# Patient Record
Sex: Male | Born: 1940 | Race: White | Hispanic: No | Marital: Married
Health system: Southern US, Community
[De-identification: ages and names within clinical notes are randomized; demographics above are authoritative.]

---

## 2005-05-15 ENCOUNTER — Ambulatory Visit: Payer: Self-pay | Admitting: Internal Medicine

## 2005-05-22 ENCOUNTER — Ambulatory Visit: Payer: Self-pay | Admitting: Neurology

## 2005-05-29 ENCOUNTER — Ambulatory Visit: Payer: Self-pay | Admitting: Neurology

## 2006-08-05 ENCOUNTER — Ambulatory Visit: Payer: Self-pay | Admitting: Urology

## 2006-08-05 ENCOUNTER — Other Ambulatory Visit: Payer: Self-pay

## 2006-08-10 ENCOUNTER — Ambulatory Visit: Payer: Self-pay | Admitting: Urology

## 2010-01-26 ENCOUNTER — Emergency Department: Payer: Self-pay | Admitting: Unknown Physician Specialty

## 2010-10-17 ENCOUNTER — Inpatient Hospital Stay: Payer: Self-pay | Admitting: Vascular Surgery

## 2010-11-04 ENCOUNTER — Inpatient Hospital Stay: Payer: Self-pay | Admitting: Vascular Surgery

## 2010-11-13 LAB — PATHOLOGY REPORT

## 2011-10-10 ENCOUNTER — Emergency Department: Payer: Self-pay | Admitting: *Deleted

## 2011-10-10 LAB — BASIC METABOLIC PANEL
Anion Gap: 8 (ref 7–16)
Calcium, Total: 9.1 mg/dL (ref 8.5–10.1)
Chloride: 105 mmol/L (ref 98–107)
Co2: 23 mmol/L (ref 21–32)
Potassium: 3.6 mmol/L (ref 3.5–5.1)
Sodium: 136 mmol/L (ref 136–145)

## 2011-10-10 LAB — CBC
HCT: 44.1 % (ref 40.0–52.0)
MCHC: 32.6 g/dL (ref 32.0–36.0)
Platelet: 210 10*3/uL (ref 150–440)
RBC: 4.65 10*6/uL (ref 4.40–5.90)
RDW: 13.8 % (ref 11.5–14.5)
WBC: 8.7 10*3/uL (ref 3.8–10.6)

## 2011-10-10 LAB — URINALYSIS, COMPLETE
Bilirubin,UR: NEGATIVE
Glucose,UR: NEGATIVE mg/dL (ref 0–75)
Nitrite: NEGATIVE
Ph: 8 (ref 4.5–8.0)
Protein: 100
RBC,UR: 2 /HPF (ref 0–5)
Specific Gravity: 1.008 (ref 1.003–1.030)
WBC UR: 296 /HPF (ref 0–5)

## 2011-10-10 LAB — CK TOTAL AND CKMB (NOT AT ARMC): CK-MB: 4.9 ng/mL — ABNORMAL HIGH (ref 0.5–3.6)

## 2011-10-10 LAB — TROPONIN I: Troponin-I: <0.02 ng/mL

## 2012-02-02 ENCOUNTER — Emergency Department: Payer: Self-pay | Admitting: Emergency Medicine

## 2012-02-02 LAB — COMPREHENSIVE METABOLIC PANEL
Albumin: 3.6 g/dL (ref 3.4–5.0)
Anion Gap: 7 (ref 7–16)
Bilirubin,Total: 0.4 mg/dL (ref 0.2–1.0)
Calcium, Total: 9.1 mg/dL (ref 8.5–10.1)
Chloride: 98 mmol/L (ref 98–107)
Co2: 26 mmol/L (ref 21–32)
Creatinine: 0.98 mg/dL (ref 0.60–1.30)
EGFR (African American): >60
Osmolality: 265 (ref 275–301)
Potassium: 4.5 mmol/L (ref 3.5–5.1)
SGOT(AST): 31 U/L (ref 15–37)
SGPT (ALT): 28 U/L (ref 12–78)

## 2012-02-02 LAB — CBC WITH DIFFERENTIAL/PLATELET
Basophil #: 0 10*3/uL (ref 0.0–0.1)
Basophil %: 0.6 %
Eosinophil #: 0 10*3/uL (ref 0.0–0.7)
Eosinophil %: 0.4 %
HCT: 40.1 % (ref 40.0–52.0)
HGB: 14 g/dL (ref 13.0–18.0)
Lymphocyte #: 0.8 10*3/uL — ABNORMAL LOW (ref 1.0–3.6)
Lymphocyte %: 14 %
MCH: 33 pg (ref 26.0–34.0)
MCHC: 34.8 g/dL (ref 32.0–36.0)
Monocyte #: 0.9 x10 3/mm (ref 0.2–1.0)
Neutrophil #: 3.8 10*3/uL (ref 1.4–6.5)
Platelet: 231 10*3/uL (ref 150–440)
RDW: 14 % (ref 11.5–14.5)

## 2012-09-14 ENCOUNTER — Inpatient Hospital Stay: Payer: Self-pay | Admitting: Internal Medicine

## 2012-09-14 LAB — CBC
HCT: 37.1 % — ABNORMAL LOW (ref 40.0–52.0)
MCH: 30.5 pg (ref 26.0–34.0)
MCHC: 33.5 g/dL (ref 32.0–36.0)
MCV: 91 fL (ref 80–100)
RBC: 4.08 10*6/uL — ABNORMAL LOW (ref 4.40–5.90)
RDW: 13.4 % (ref 11.5–14.5)

## 2012-09-14 LAB — BASIC METABOLIC PANEL
BUN: 23 mg/dL — ABNORMAL HIGH (ref 7–18)
Calcium, Total: 9.3 mg/dL (ref 8.5–10.1)
Chloride: 98 mmol/L (ref 98–107)
Co2: 25 mmol/L (ref 21–32)
Creatinine: 1.08 mg/dL (ref 0.60–1.30)
EGFR (African American): >60
Glucose: 109 mg/dL — ABNORMAL HIGH (ref 65–99)
Osmolality: 265 (ref 275–301)
Potassium: 3.5 mmol/L (ref 3.5–5.1)
Sodium: 130 mmol/L — ABNORMAL LOW (ref 136–145)

## 2012-09-14 LAB — CK TOTAL AND CKMB (NOT AT ARMC): CK-MB: 2.5 ng/mL (ref 0.5–3.6)

## 2012-09-14 LAB — TROPONIN I
Troponin-I: <0.02 ng/mL
Troponin-I: <0.02 ng/mL

## 2012-09-15 LAB — CBC WITH DIFFERENTIAL/PLATELET
Basophil %: 0.1 %
HCT: 32.4 % — ABNORMAL LOW (ref 40.0–52.0)
HGB: 11 g/dL — ABNORMAL LOW (ref 13.0–18.0)
Lymphocyte %: 10.9 %
Monocyte #: 0.2 x10 3/mm (ref 0.2–1.0)
Monocyte %: 8.8 %
Neutrophil %: 80.2 %
Platelet: 227 10*3/uL (ref 150–440)
RBC: 3.57 10*6/uL — ABNORMAL LOW (ref 4.40–5.90)
RDW: 13.5 % (ref 11.5–14.5)
WBC: 2.1 10*3/uL — ABNORMAL LOW (ref 3.8–10.6)

## 2012-09-15 LAB — BASIC METABOLIC PANEL
Anion Gap: 9 (ref 7–16)
BUN: 22 mg/dL — ABNORMAL HIGH (ref 7–18)
Calcium, Total: 8.8 mg/dL (ref 8.5–10.1)
Creatinine: 0.92 mg/dL (ref 0.60–1.30)
Glucose: 161 mg/dL — ABNORMAL HIGH (ref 65–99)
Osmolality: 279 (ref 275–301)
Potassium: 2.9 mmol/L — ABNORMAL LOW (ref 3.5–5.1)

## 2012-09-15 LAB — MAGNESIUM: Magnesium: 2 mg/dL

## 2012-09-15 LAB — TSH: Thyroid Stimulating Horm: 0.283 u[IU]/mL — ABNORMAL LOW

## 2012-09-16 LAB — BASIC METABOLIC PANEL
Anion Gap: 6 — ABNORMAL LOW (ref 7–16)
Calcium, Total: 9.1 mg/dL (ref 8.5–10.1)
Chloride: 111 mmol/L — ABNORMAL HIGH (ref 98–107)
Co2: 24 mmol/L (ref 21–32)
Creatinine: 0.99 mg/dL (ref 0.60–1.30)
EGFR (African American): >60
EGFR (Non-African Amer.): >60
Glucose: 93 mg/dL (ref 65–99)
Potassium: 3.1 mmol/L — ABNORMAL LOW (ref 3.5–5.1)
Sodium: 141 mmol/L (ref 136–145)

## 2012-09-16 LAB — CBC WITH DIFFERENTIAL/PLATELET
HGB: 11.6 g/dL — ABNORMAL LOW (ref 13.0–18.0)
MCH: 31.3 pg (ref 26.0–34.0)
MCHC: 34.5 g/dL (ref 32.0–36.0)
MCV: 91 fL (ref 80–100)
Monocyte #: 1.1 x10 3/mm — ABNORMAL HIGH (ref 0.2–1.0)
Monocyte %: 12.4 %
Neutrophil #: 7 10*3/uL — ABNORMAL HIGH (ref 1.4–6.5)
Neutrophil %: 81.7 %
Platelet: 279 10*3/uL (ref 150–440)
RBC: 3.7 10*6/uL — ABNORMAL LOW (ref 4.40–5.90)
WBC: 8.5 10*3/uL (ref 3.8–10.6)

## 2012-10-07 ENCOUNTER — Other Ambulatory Visit: Payer: Self-pay

## 2012-10-07 LAB — URINALYSIS, COMPLETE
Bacteria: NONE SEEN
Blood: NEGATIVE
Glucose,UR: NEGATIVE mg/dL (ref 0–75)
Ketone: NEGATIVE
Nitrite: NEGATIVE
Protein: NEGATIVE
RBC,UR: 1 /HPF (ref 0–5)
Specific Gravity: 1.009 (ref 1.003–1.030)
Squamous Epithelial: NONE SEEN

## 2013-01-15 ENCOUNTER — Inpatient Hospital Stay: Payer: Self-pay | Admitting: Internal Medicine

## 2013-01-15 LAB — URINALYSIS, COMPLETE
Bilirubin,UR: NEGATIVE
Cellular Cast: 2
Nitrite: NEGATIVE
Ph: 7 (ref 4.5–8.0)
Protein: 30
RBC,UR: <1 /HPF (ref 0–5)
Specific Gravity: 1.005 (ref 1.003–1.030)
Squamous Epithelial: NONE SEEN

## 2013-01-15 LAB — CBC
HCT: 29.5 % — ABNORMAL LOW (ref 40.0–52.0)
HGB: 10.1 g/dL — ABNORMAL LOW (ref 13.0–18.0)
MCH: 30.6 pg (ref 26.0–34.0)
MCHC: 34.3 g/dL (ref 32.0–36.0)
Platelet: 278 10*3/uL (ref 150–440)
RBC: 3.3 10*6/uL — ABNORMAL LOW (ref 4.40–5.90)
RDW: 15.3 % — ABNORMAL HIGH (ref 11.5–14.5)

## 2013-01-15 LAB — BASIC METABOLIC PANEL
Anion Gap: 8 (ref 7–16)
BUN: 14 mg/dL (ref 7–18)
Calcium, Total: 8.5 mg/dL (ref 8.5–10.1)
Chloride: 109 mmol/L — ABNORMAL HIGH (ref 98–107)
Co2: 21 mmol/L (ref 21–32)
Creatinine: 1.43 mg/dL — ABNORMAL HIGH (ref 0.60–1.30)
EGFR (African American): 56 — ABNORMAL LOW
EGFR (Non-African Amer.): 48 — ABNORMAL LOW
Glucose: 102 mg/dL — ABNORMAL HIGH (ref 65–99)
Osmolality: 276 (ref 275–301)
Potassium: 2 mmol/L — CL (ref 3.5–5.1)
Sodium: 138 mmol/L (ref 136–145)

## 2013-01-15 LAB — TROPONIN I: Troponin-I: 0.03 ng/mL

## 2013-01-16 LAB — CBC WITH DIFFERENTIAL/PLATELET
Basophil #: 0 10*3/uL (ref 0.0–0.1)
Basophil %: 0.5 %
Eosinophil #: 0.1 10*3/uL (ref 0.0–0.7)
Eosinophil %: 2.2 %
HCT: 25 % — ABNORMAL LOW (ref 40.0–52.0)
Lymphocyte #: 0.4 10*3/uL — ABNORMAL LOW (ref 1.0–3.6)
Lymphocyte %: 9.4 %
Monocyte #: 0.6 x10 3/mm (ref 0.2–1.0)
Monocyte %: 12.6 %
Neutrophil %: 75.3 %
Platelet: 249 10*3/uL (ref 150–440)
RDW: 16 % — ABNORMAL HIGH (ref 11.5–14.5)
WBC: 4.4 10*3/uL (ref 3.8–10.6)

## 2013-01-16 LAB — BASIC METABOLIC PANEL
Anion Gap: 7 (ref 7–16)
Chloride: 119 mmol/L — ABNORMAL HIGH (ref 98–107)
Co2: 18 mmol/L — ABNORMAL LOW (ref 21–32)
EGFR (Non-African Amer.): >60
Osmolality: 286 (ref 275–301)
Potassium: 2.9 mmol/L — ABNORMAL LOW (ref 3.5–5.1)
Sodium: 144 mmol/L (ref 136–145)

## 2013-01-16 LAB — MAGNESIUM: Magnesium: 1.9 mg/dL

## 2013-01-17 LAB — BASIC METABOLIC PANEL
Anion Gap: 7 (ref 7–16)
BUN: 10 mg/dL (ref 7–18)
Calcium, Total: 8.2 mg/dL — ABNORMAL LOW (ref 8.5–10.1)
Chloride: 123 mmol/L — ABNORMAL HIGH (ref 98–107)
Creatinine: 1.09 mg/dL (ref 0.60–1.30)
EGFR (African American): >60
EGFR (Non-African Amer.): >60
Glucose: 78 mg/dL (ref 65–99)
Osmolality: 287 (ref 275–301)
Potassium: 3.8 mmol/L (ref 3.5–5.1)

## 2013-04-24 DEATH — deceased

## 2013-11-17 IMAGING — CR DG CHEST 2V
1 series · 2 of 2 positions shown · non-contrast
Comparison: none

REASON FOR EXAM: COPD/ Pneumonia
COMMENTS:

PROCEDURE:     DXR - DXR CHEST PA (OR AP) AND LATERAL  - September 15, 2012  [DATE]
RESULT:     Comparison: 09/14/2012

[Series 1: w chest pa · 0.14mm/px · 2 of 2 slices shown]
[im 1/2]
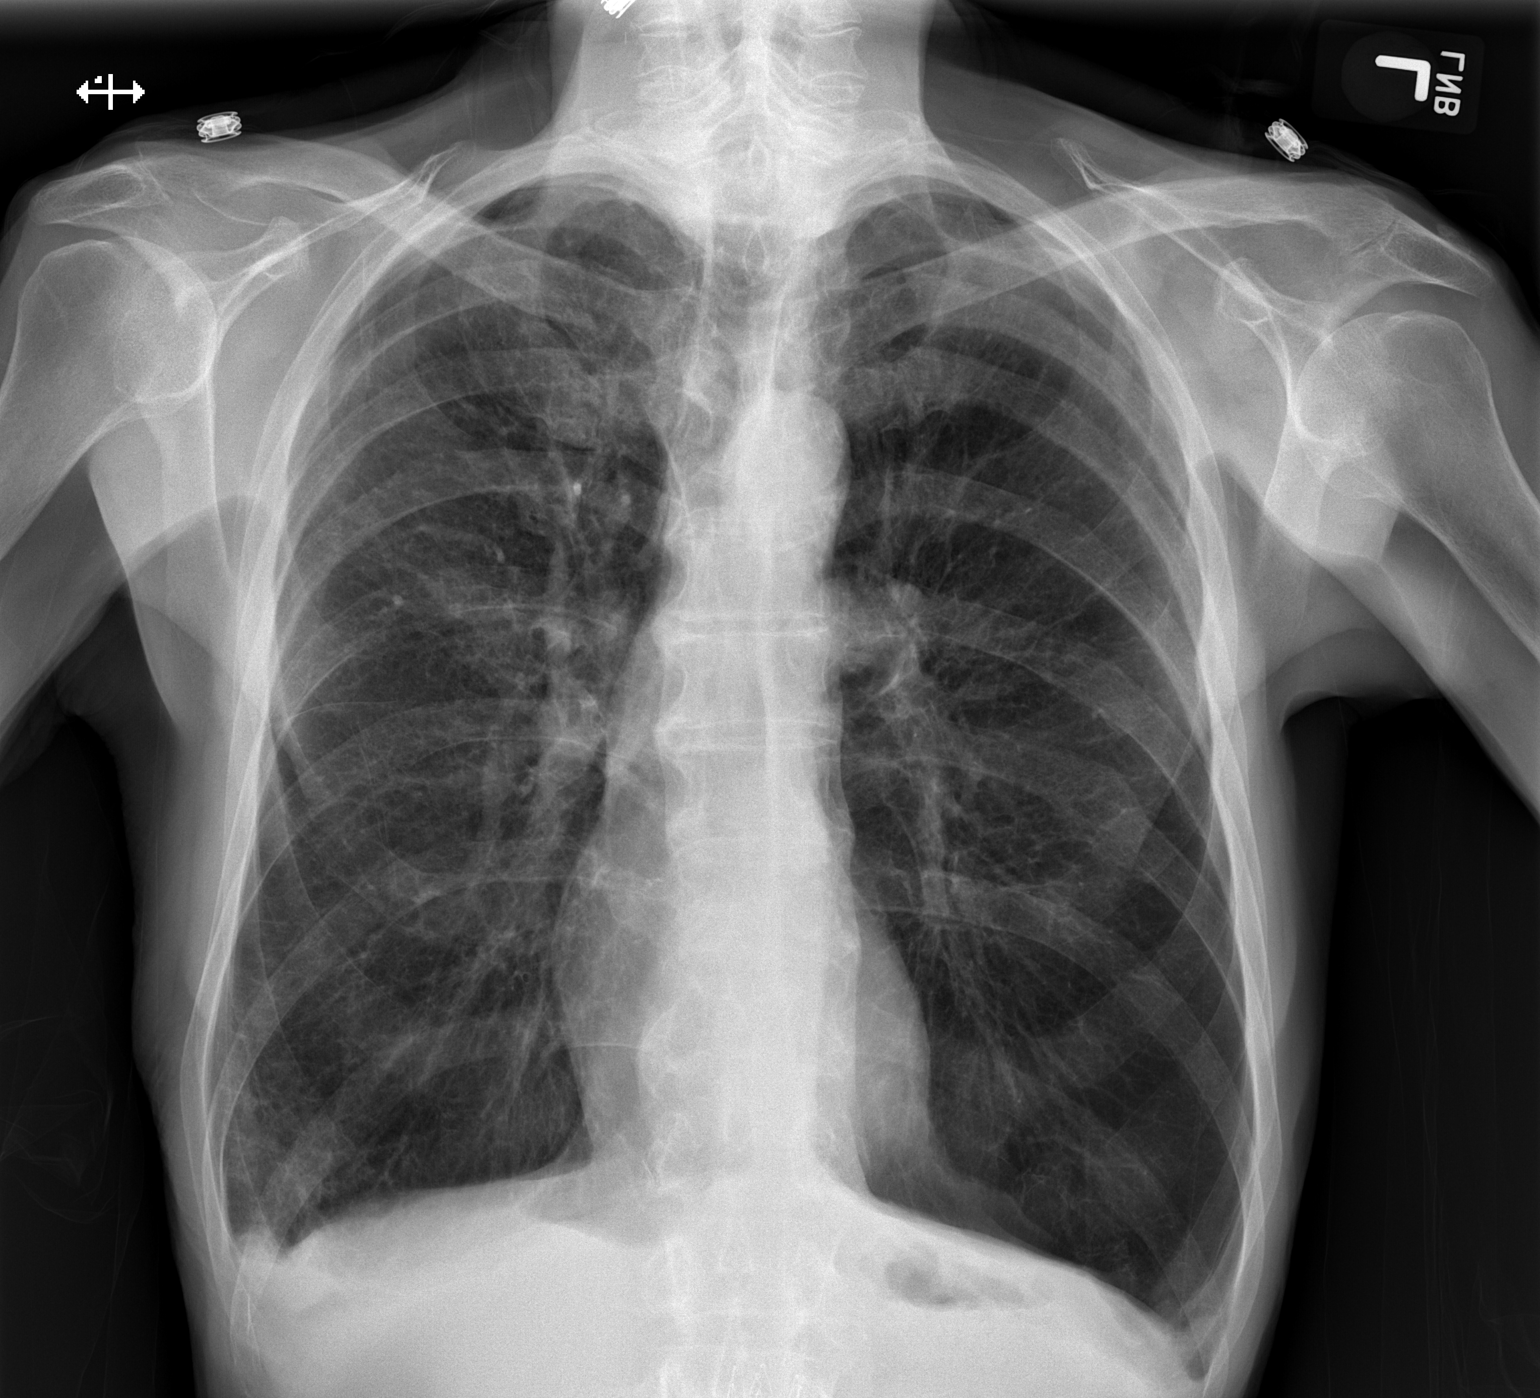
[im 2/2]
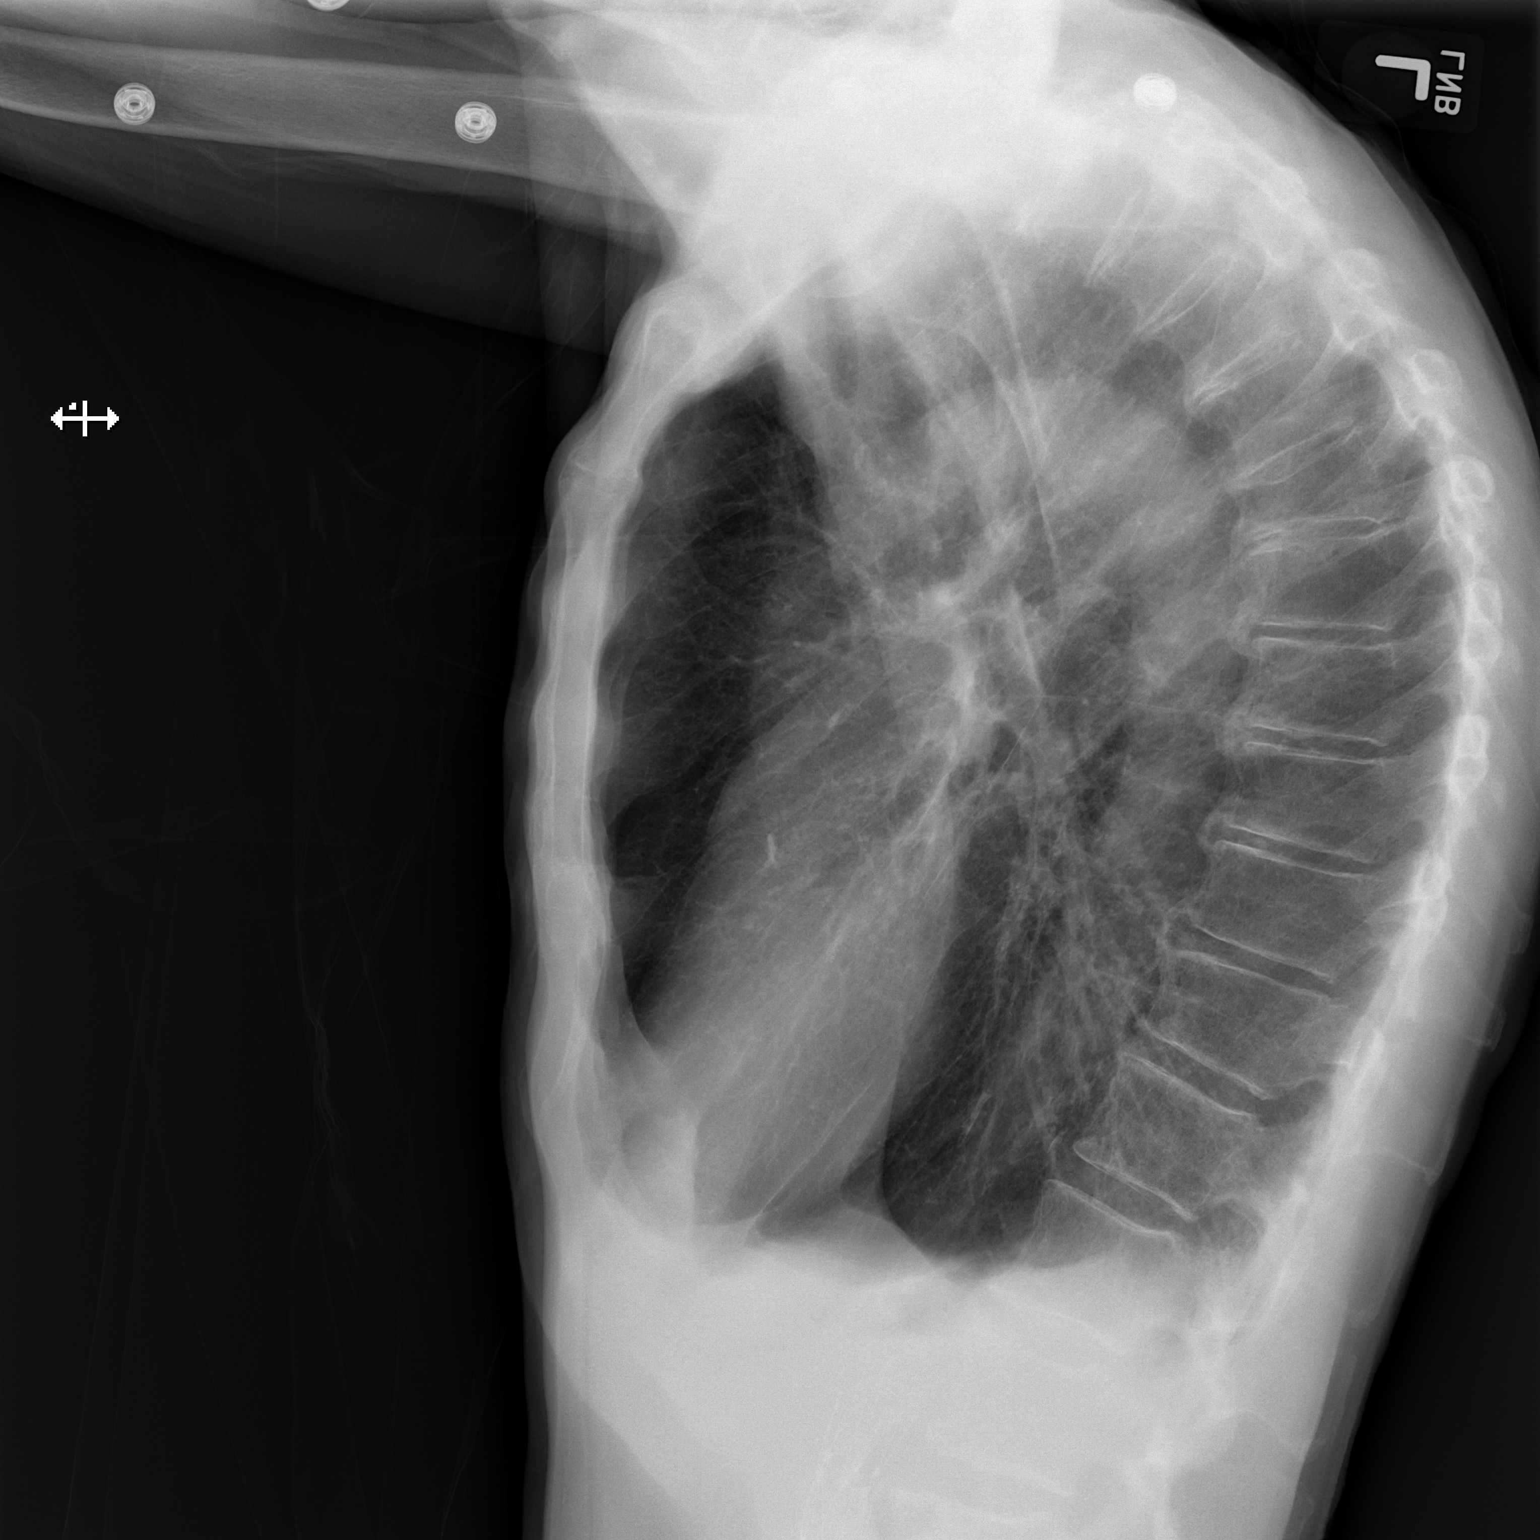

[2 of 2 positions shown; findings below may reference images not displayed]

FINDINGS: PA and lateral chest radiographs are provided. The lungs are hyperinflated
likely secondary to COPD. There is no focal parenchymal opacity, pleural
effusion, or pneumothorax. The heart and mediastinum are unremarkable.  The
osseous structures are unremarkable.
IMPRESSION: No acute disease of the che[REDACTED]

## 2014-03-19 IMAGING — CT CT HEAD WITHOUT CONTRAST
2 series · 15 of 30 positions shown, 17 images · non-contrast
Comparison: 02/02/2012.

CLINICAL DATA: History of head trauma

EXAM:
CT HEAD WITHOUT CONTRAST
TECHNIQUE: Contiguous axial images were obtained from the base of the skull
through the vertex without intravenous contrast.

[Series 2: head wo · axial · 0.43mm/px · z∈[-58,+52]mm · 7 of 30 slices shown, 9 images]
[im 4/30  brain]
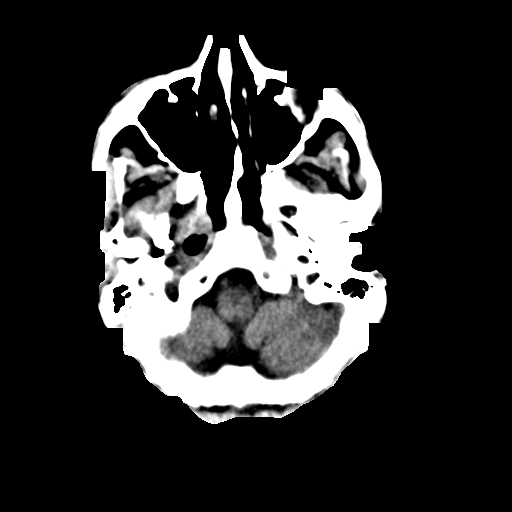
[im 4/30  bone]
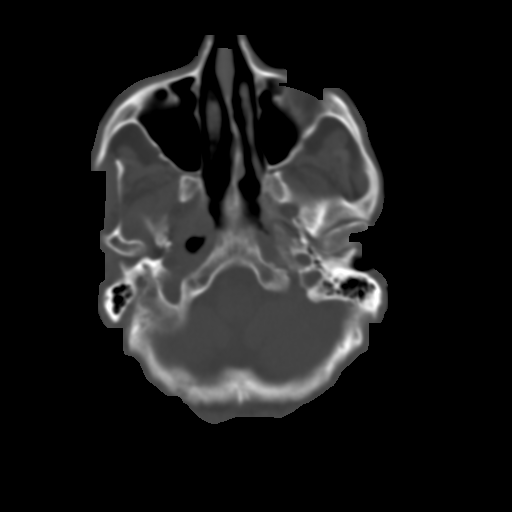
[im 8/30  brain]
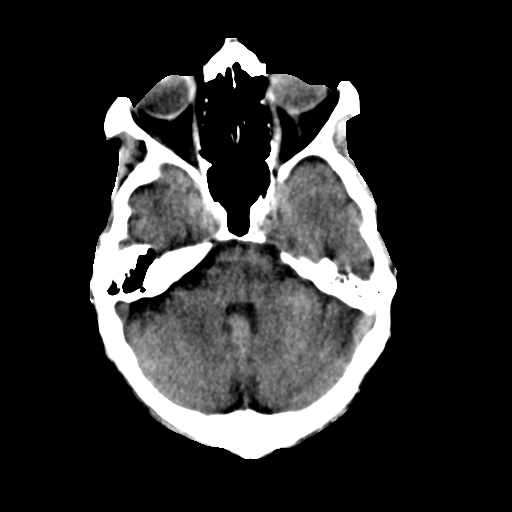
[im 11/30  brain]
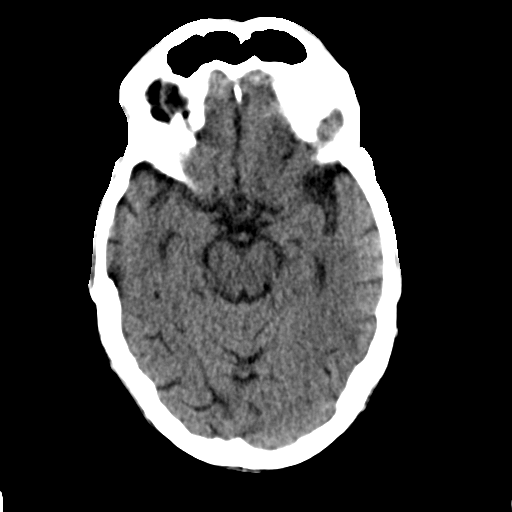
[im 15/30  brain]
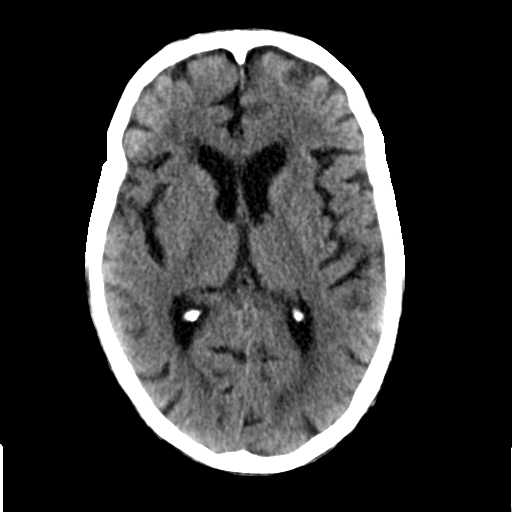
[im 19/30  brain]
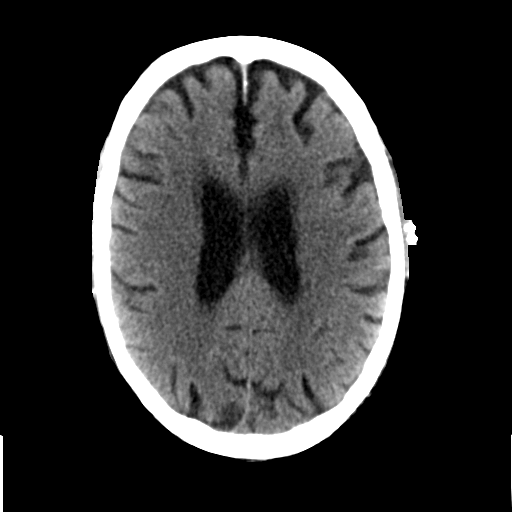
[im 19/30  bone]
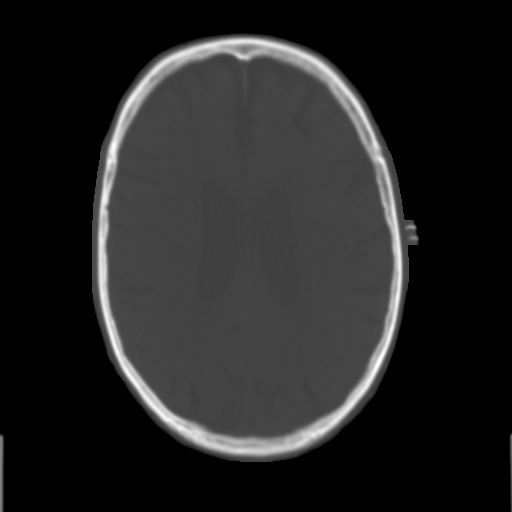
[im 22/30  brain]
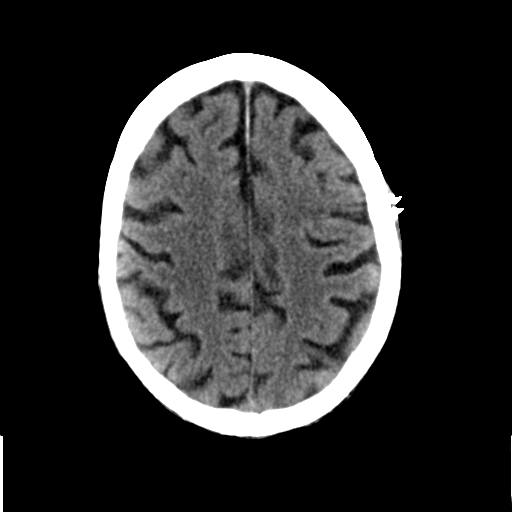
[im 26/30  brain]
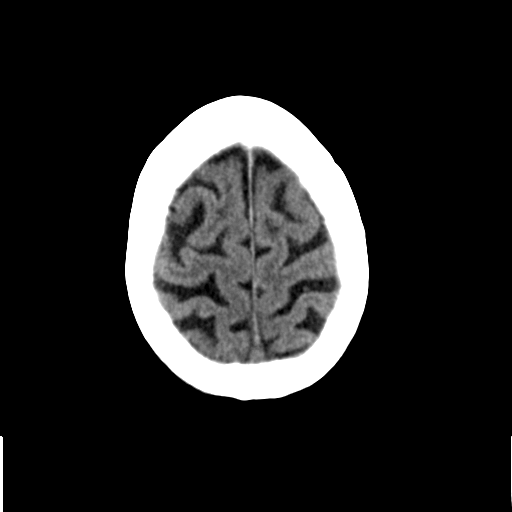

[Series 3: head bone · axial · 0.43mm/px · z∈[-59,+59]mm · 8 of 75 slices shown]
[im 8/75  bone]
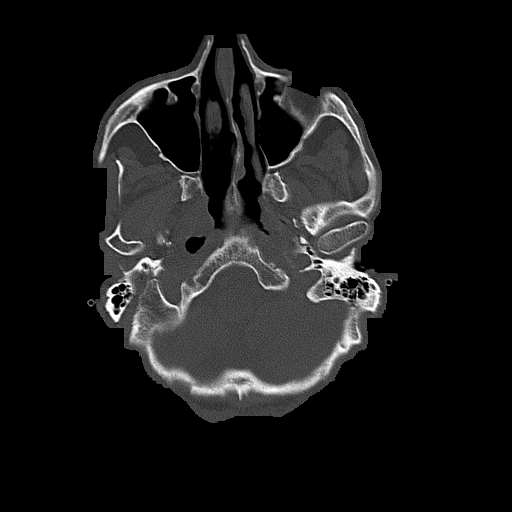
[im 15/75  bone]
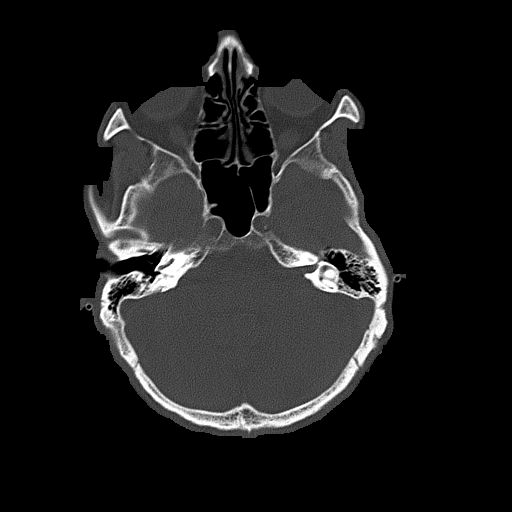
[im 23/75  bone]
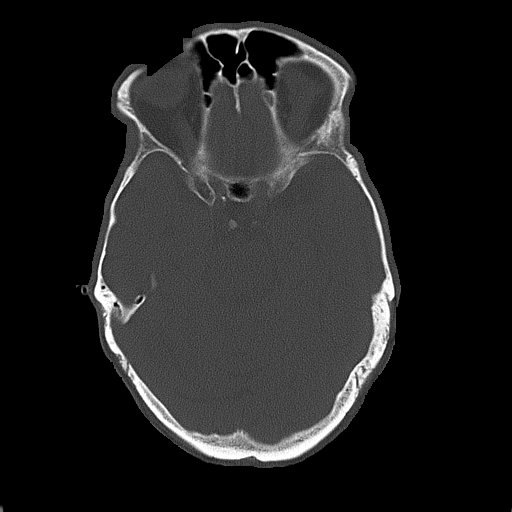
[im 34/75  bone]
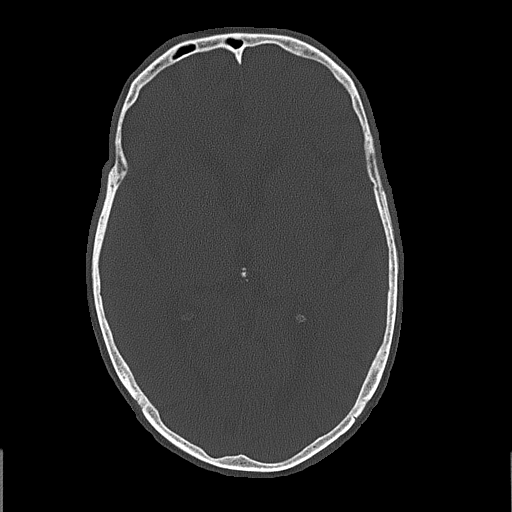
[im 41/75  bone]
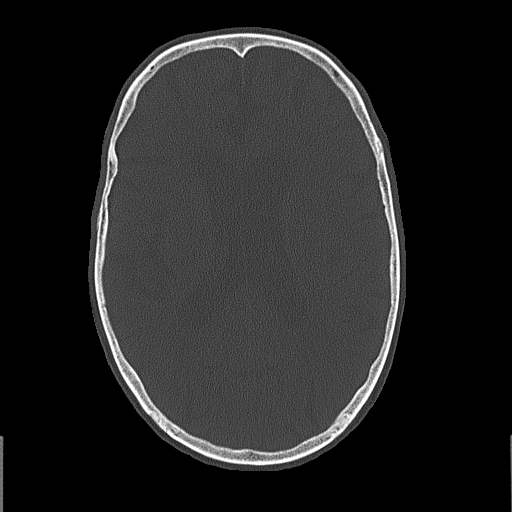
[im 52/75  bone]
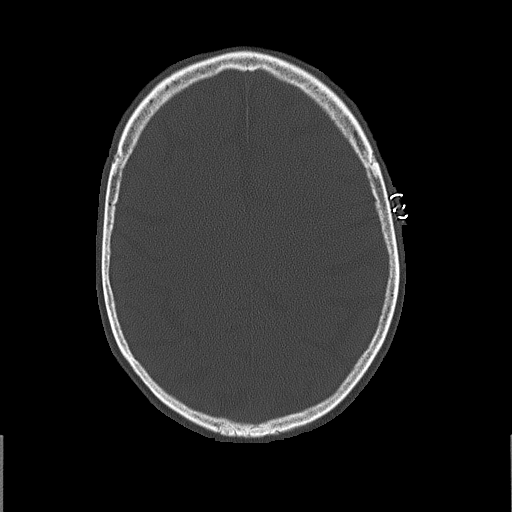
[im 60/75  bone]
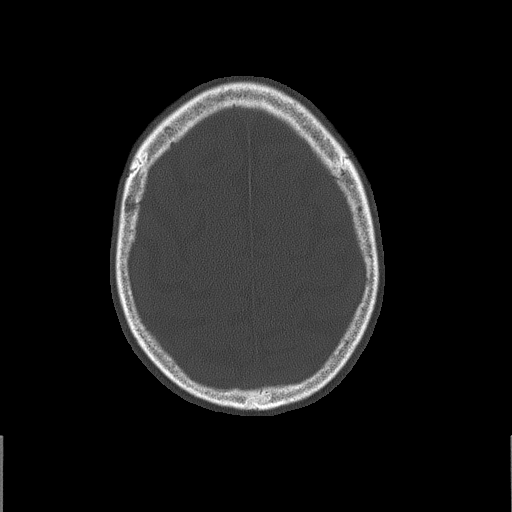
[im 67/75  bone]
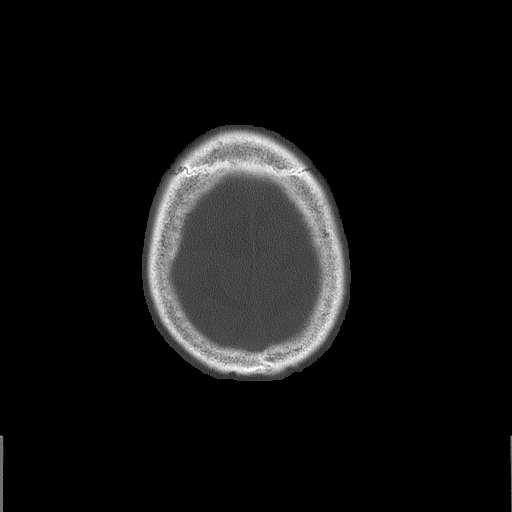

[15 of 30 positions shown; findings below may reference images not displayed]

FINDINGS: There is no evidence of intra-axial or extra-axial fluid collections
nor acute hemorrhage. There is diffuse cortical atrophy. Diffuse
areas of low attenuation project within the subcortical, deep, and
periventricular white matter regions. There is no evidence of
subfalcine or tonsillar herniation. Cerebellar atrophy is
appreciated. The pons is grossly unremarkable. There is no evidence
of a depressed skull fracture. The visualized paranasal sinuses and
mastoid air cells are patent. A scalp hematoma is identified within
the posterior frontal parietal region on the left.
IMPRESSION: Chronic and involutional changes without evidence of focal acute
abnormalities. Scalp hematoma.

## 2014-06-16 NOTE — H&P (Signed)
PATIENT NAME:  Brian EchevariaVERETTE, Brian Sandoval MR#:  [id number] DATE OF BIRTH:  [birth year]  DATE OF ADMISSION:  01/15/2013  ADDENDUM  Acute kidney injury: The patient has increase his creatinine up to 1.43. His creatinine was around 0.9, for what we are going to give continuous IV fluids. The patient looks dehydrated. This is likely prerenal due to lack of proper hydration at home and orthostats.   Anemia of chronic disease with a hemoglobin around 10.  We are going to just continue to watch, possible just give him some iron Sandoval.o. I doubt this is chronic as he has normocytic, normochromic anemia.  Current tobacco use:  The patient has been given smoking cessation for over 3 minutes. The patient states that he is not ready to quit that he has been smoking all his life, and he wants to go with hospice anyway.   TIME SPENT:  I spent about 45 minutes with this patient.    ____________________________ Felipa Furnaceoberto Sanchez Gutierrez, MD rsg:ce D: 01/15/2013 15:06:50 ET T: 01/15/2013 15:50:06 ET JOB#: [id number]  cc: Felipa Furnaceoberto Sanchez Gutierrez, MD, <Dictator> Meadow Abramo Juanda ChanceSANCHEZ GUTIERRE MD ELECTRONICALLY SIGNED 01/17/2013 13:06

## 2014-06-16 NOTE — Discharge Summary (Signed)
PATIENT NAME:  Brian Sandoval, Brian Sandoval MR#:  [id number] DATE OF BIRTH:  1940/08/30  DATE OF ADMISSION:  09/14/2012 DATE OF DISCHARGE:  09/17/2012  PRIMARY CARE PHYSICIAN:  Nonlocal.  ADDENDUM:  The patient's discharge was suspended due to social issue.  Assisted living facility could not accept the patient, so the patient will be discharged to home with home health today. I discussed the patient's discharge plan with the patient's nurse and the case manager.   TIME SPENT: About 33 minutes.  ____________________________ Shaune PollackQing Dace Denn, MD qc:sb D: 09/17/2012 12:55:54 ET T: 09/17/2012 13:10:16 ET JOB#: [id number]  cc: Shaune PollackQing Amrita Radu, MD, <Dictator> Shaune PollackQING Promiss Labarbera MD ELECTRONICALLY SIGNED 09/17/2012 14:41

## 2014-06-16 NOTE — Discharge Summary (Signed)
PATIENT NAME:  Brian Sandoval, Brian Sandoval MR#:  [id number] DATE OF BIRTH:  1940/09/21  DATE OF ADMISSION:  01/15/2013 DATE OF DISCHARGE:  01/17/2013  Discharged to home with hospice.   DISCHARGE DIAGNOSES: 1.  Dehydration.  2.  Orthostatic syncope.  3.  Recurrent falls.  4.  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  5.  Hypokalemia.  6.  Anemia of chronic disease.   ADMITTING HISTORY AND PHYSICAL AND HOSPITAL COURSE: Please see detailed H and Sandoval dictated by Dr. Mordecai MaesSanchez. In brief, this is a 74 year old male patient with history of end-stage chronic obstructive pulmonary disease on home hospice, presented to the hospital with recurrent falls. The patient was found to be severely dehydrated and orthostatic hypotension, hypokalemia, admitted to the hospitalist service. The patient was admitted onto telemetry floor. He did not want any aggressive work-up or surgeries for which an echo and carotid Doppler were not checked. The patient was on tele floor, showed no arrhythmia with IV fluid resuscitation. The patient felt better. His dehydration had resolved orthostatic vitals were normal. The patient will be discharged back home on hospice. His hypokalemia was replaced with IV and oral and he will be on 40 mEq potassium twice a day. He will continue oxygen 2 liters at home. The patient did have a small scalp hematoma. No intracranial bleeds on CT.   DISCHARGE MEDICATIONS:  1.  Potassium chloride 20 mEq 2 tablets oral 2 times a day.  2.  Sodium bicarbonate 650 mg oral 2 times a day.  3.  Symbicort 164.5, two puffs inhaled 2 times a day.  4.  Montelukast 10 mg oral once a day.  5.  Sertraline 100 mg oral once a day.  6.  Albuterol nebulizer  every six hours prn.  7.  Tazocin 2 mg 2 capsules oral once a day.   DISCHARGE INSTRUCTIONS: Follow up with hospice, which has been set up at home. Regular consistency food, regular activity. The patient has been instructed to keep  himself well hydrated with fluids.   TIME SPENT: On day of discharge and discharge activity was 40 minutes.   ____________________________ Molinda BailiffSrikar R. Geena Weinhold, MD srs:cc D: 01/17/2013 15:13:58 ET T: 01/17/2013 22:56:41 ET JOB#: [id number]  cc: Wardell HeathSrikar R. Elpidio AnisSudini, MD, <Dictator> Orie FishermanSRIKAR R Azel Gumina MD ELECTRONICALLY SIGNED 01/18/2013 16:43

## 2014-06-16 NOTE — H&P (Signed)
PATIENT NAME:  Brian Sandoval, Brian Sandoval MR#:  [id number] DATE OF BIRTH:  1940/04/21  DATE OF ADMISSION:  01/15/2013  PRIMARY CARE PHYSICIAN:  At the [place].  REFERRING PHYSICIAN:  Governor Rooks, MD  REASON FOR ADMISSION: Syncopal episode with orthostatic hypotension and severe hypokalemia.   HISTORY OF PRESENT ILLNESS: A very nice 74 year old gentleman who has history of chronic respiratory failure, end-stage lung disease. Apparently, the patient had been living with his daughter for the past 5 to 6 months. He is staying with her because he does not have the means to live by himself. He sleeps on a blowup mattress and, overall, he is being a little bit unsteady on his feet. He has had 3 episodes of syncope within the past 2 months for what he has consulted to his primary care physician at the [facility] and previous home health agency that work with him and they both recommended him to go to hospice. He has signed up for hospice but today he was going into the bathroom. He walked up there normally like he always does and was going to use the commode but did even have the time and he fell, passed out first and had significant trauma on his head. He was passed out for 30 seconds and woke up. He was brought by EMS with a laceration on the left side of his skull. The patient states that he is not having any significant headache, but whenever he was evaluated here he had significant drop of his blood pressure with orthostatics with increased pulse up to 110, and his potassium was checked down to 2.0. His blood pressure dropped from a systolic of 111 by lying down, down to 68 while standing. His pulses stay around 100. The patient is admitted for treatment and evaluation of this problem as the patient being end-stage lung disease with hospice orders, we are not going to proceed to do a big workup. The patient says it is not going to be necessary. The patient is going to be hydrated. His potassium is going to be corrected and  possibly be home in 1 or 2 days unless any major changes. The patient is asymptomatic right now. He had a laceration that was sutured by the ER physician.  REVIEW OF SYSTEMS: A 12-system review of systems is done.  CONSTITUTIONAL: Denies any fever. Positive chronic fatigue. Positive for significant weight loss of more than 50 pounds 5 years ago and the patient states that he has never been able to recover that weight.  He had an extensive work up in the [place] to look for cancer and he was negative. It was most likely secondary to his COPD.  EYES: No blurry vision, double vision.  ENT: No difficulty swallowing. No tinnitus. No epistaxis.  RESPIRATORY: Positive chronic cough, chronic wheezing, chronic dyspnea. He is on oxygen at 2 liters nasal cannula. Denies any hemoptysis.  CARDIOVASCULAR: No chest pain. No orthopnea. No edema. No arrhythmias. No palpitations. Positive syncope 3 times in the last 2 months. GASTROINTESTINAL: No nausea, vomiting, abdominal pain, constipation, diarrhea.  GENITOURINARY: No dysuria, hematuria, changes in frequency. The patient states that he urinates a lot due to his enlarged prostate.  ENDOCRINE: No polyuria, polydipsia, polyphagia, cold or heat intolerance. HEMATOLOGIC AND LYMPHATIC: No anemia, easy bruising or bleeding.  SKIN: No rashes, petechiae or new lesions.  MUSCULOSKELETAL: No significant neck pain, back pain or gout.  NEUROLOGIC: No numbness, tingling, CVA or TIA.  PSYCHIATRIC: No significant anxiety, depression or insomnia.  PAST MEDICAL HISTORY: 1.  End-stage COPD.  2.  Chronic respiratory failure, on 2 liters of oxygen. The patient has been on oxygen for 1 year.  3.  Chronic hypokalemia.  4.  Mild insomnia.   ALLERGIES: No known drug allergies.   SOCIAL HISTORY: The patient smoked up to 1 to 2 packs a day for most of his adult life, over 50 years; now he smokes 1 pack in a week. He moved with his daughter back in June and he sleeps on a blowup  mattress. He goes to the [place] for his medical care. He does not drink, but he smokes marijuana occasionally.   FAMILY HISTORY: Negative for coronary artery disease or cancer.   PAST SURGICAL HISTORY: The patient states that he had hernioplasty.   CURRENT MEDICATIONS: Include: 1.  Ambien 5 mg daily. 2.  Spiriva 18 mcg. The patient states that he is not taking it because he had some reactions to it and he will not take it anymore. 3.  Symbicort 160/4.5 two puffs twice daily. 4.  Bicarbonate 650 mg twice daily. 5.  Sertraline 100 mg once a day. 6.  Proventil 2 puffs 4 times a day.  7.  Prednisone 10 mg which is a taper. 8.  Potassium chloride 20 mEq 2 tablets 2 times a day. 9.  Singulair 10 mg daily. 10.  Aspirin 81 mg daily.   PHYSICAL EXAMINATION: VITAL SIGNS: Blood pressure of 135/78, pulse 110, respirations 20 to 22, temperature 98, oxygen saturation 95% on 2 liters of oxygen.  GENERAL: The patient is alert, oriented x 3, no acute distress. No respiratory distress. Hemodynamically stable. He looks chronically ill. He has a laceration that has been fixed with staples on the left temporal area, and there is blood on his gown and pillow.  No acute bleeding at this moment.  HEENT: Pupils are equal and reactive. Extraocular movements are intact. Mucosa dry. Anicteric sclerae. Pink conjunctivae. No oral lesions. No oropharyngeal exudates. NECK: Supple. No JVD. No thyromegaly. No adenopathy. No carotid bruits are appreciated at this moment. The patient has no neck rigidity.  CARDIOVASCULAR: Regular rate and rhythm. No murmurs, rubs or gallops are appreciated at this moment. No displacement of PMI. No tenderness to palpation anterior chest wall.  LUNGS: Showing some occasional rhonchi, which apparently is chronic in this patient. No wheezing. No decrease of respiratory sounds. Good air entrance. No use of accessory muscles.  ABDOMEN: Soft, nontender, nondistended. No hepatosplenomegaly. No masses.  The patient has excavatum.  Bowel sounds are positive.  GENITAL: Deferred.  EXTREMITIES: No edema, cyanosis or clubbing. Pulses around +2. Capillary refill around 4 to 5 seconds.  SKIN: Dry. No rashes or petechiae.  MUSCULOSKELETAL: No significant joint abnormalities or effusions.  PSYCHIATRIC: The patient is very pleasant, alert, oriented x 3, good judgment. No agitation.  NEUROLOGIC: Cranial nerves II through XII intact. His strength is 5/5 in 4 extremities.  LABORATORY, DIAGNOSTIC AND RADIOLOGICAL DATA: Glucose 102, BUN 14, creatinine is 1.43; previous creatinine in July was 0.9. His potassium is 2.0, chloride 109. GFR is around 48. Troponin is 0.03. TSH has been previously down to 0.2 in July. His white count is 6. His hemoglobin is 10. His platelet count is 2.78 and his anemia is normal.   EKG: Sinus tachycardia, no ST depression or elevation.  CT scan of the head without contrast show chronic involutional changes without evidence of focal acute abnormalities and scalp hematoma.   ASSESSMENT AND PLAN: 1.  Syncope: The  patient presented with syncope that has happened at least 3 times over the past 2 months, seems to be orthostatic in nature as he had a significant decrease of his blood pressure from 111 systolic while lying down, down to 68 systolic while standing.  He is tachycardic. He looks dehydrated, so likely this is multifactorial as the patient is taking some medications like terazosin that could lower his blood pressure. He also takes potassium that he has not been taking for the past 6 days. This could also play a role in his syncopal episode. We are going to keep him under telemetry, monitor closely, give him some fluids to help his blood pressure. He is clinically dehydrated with increased chloride, increased creatinine and orthostatic.  2.  Orthostatic hypotension:  Continue to give IV fluids. We are going to try to avoid medications to lower his blood pressure. The patient is  apparently taking terazosin. He has come out of a prednisone taper for what we are just going to give him low-dose prednisone to continue for several days. 3.  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 We are going to check his magnesium stat and monitor closely. We will replace IV n.p.o. Again, this is a chronic problem he has had for over 5 years and he had low potassium levels on regular basis. He takes 40 mg twice daily that we are going to reintroduce today.  4.  Deep vein thrombosis prophylaxis with heparin, gastrointestinal prophylaxis with Protonix. 5.  The patient is a hospice patient for what he does not want to get a lot of workup done. We are not going to check echocardiogram or ultrasound of the neck right now as there is not clear indication anyway due to the significant orthostatic hypotension with the patient not drinking enough. He also wants to go to home with hospice after his discharge from here.   TIME SPENT:  I spent about 45 minutes with this admission.   ____________________________ Felipa Furnaceoberto Sanchez Gutierrez, MD rsg:ce D: 01/15/2013 15:00:56 ET T: 01/15/2013 15:18:07 ET JOB#: [id number]  cc: Felipa Furnaceoberto Sanchez Gutierrez, MD, <Dictator> Rakesh Dutko Juanda ChanceSANCHEZ GUTIERRE MD ELECTRONICALLY SIGNED 01/17/2013 13:06

## 2014-06-16 NOTE — H&P (Signed)
PATIENT NAME:  Brian Sandoval, Brian Sandoval MR#:  [id number] DATE OF BIRTH:  1940-07-29  DATE OF ADMISSION:  09/14/2012  PRIMARY CARE:  At [place].   REFERRING EMERGENCY ROOM PHYSICIAN: Dr. Jene Every.   CHIEF COMPLAINT: Worsening shortness of breath.   HISTORY OF PRESENT ILLNESS: The patient is a 74 year old male with past medical history of chronic obstructive pulmonary disease and multiple vascular surgeries. He is currently staying with his daughter for the last two months. Before that, he was staying in [facility] as an assisted living facility. For the last two months since he moved with his daughter, he is not able to get enough attention or help from his daughter because of her busy day to day schedule, and his condition is gradually worsening. He said that he lost almost 20 pound weight in the last two months and he is getting progressively worse and short of breath now. In the past, he used to have oxygen continuously at night and in the daytime on an as needed basis or while having nebulizer treatment. But now for the last 2 to 3 weeks, he said that he is even getting short of breath while walking 4 to 5 steps and he has to use oxygen continuously 3 to 4 liters, and so finally he decided that he needed to go to the hospital and then he might need to go to a facility, and would never be able to go back home. On further questioning, he denies any fever, cough or any sputum production or any chest pain. He denies any blood in the sputum or any blood in the stool. He denies any leg swelling.   REVIEW OF SYSTEMS:  CONSTITUTIONAL: Negative for fever. Positive for fatigue and weakness and weight loss.  EYES: No blurring or double vision or discharge in the eyes.  ENT: No tinnitus, ear pain or hearing loss.  RESPIRATORY: No cough but has wheezing and shortness of breath. No hemoptysis. No painful respiration.  CARDIOVASCULAR: No chest pain, orthopnea, edema, arrhythmia or palpitations.  GASTROINTESTINAL:  No nausea, vomiting, diarrhea, abdominal pain or hematemesis.  GENITOURINARY: No dysuria or hematuria but does have some sensation of difficulty in urination since he started using Symbicort.  ENDOCRINE: No polyuria,  nocturia or heat or cold intolerance.  SKIN: No acne or rashes.  MUSCULOSKELETAL: No pain or swelling in the joints.  NEUROLOGICAL: No numbness, weakness, tremors or vertigo.  PSYCHIATRIC: No anxiety, insomnia or bipolar disorder.   VITAL SIGNS: Temperature 98.4, pulse 102, respirations 26, and blood pressure 141/78 and using 3 to 4 liters nasal cannula oxygen supplementation with saturating up to 90% on that.   PAST MEDICAL HISTORY: Chronic terminal chronic obstructive pulmonary disease and oxygen use at home.  Multiple vascular surgeries.   ALLERGIES: No known drug allergies.   SOCIAL HISTORY: 40 pack-year smoking history and occasional alcohol use. He smoked marijuana and the last time he smoked was last week. He tried quitting smoking a few months ago and did not smoke, but started again in March and currently smoking again. He was living at [facility] as an assisted living facility since October until two months ago and came to his daughter's home for the last two months.   FAMILY HISTORY: No significant history of coronary artery disease or diabetes.   CURRENT HOME MEDICATIONS: He takes inhaler, potassium tablets and nebulizers. He does not know the name of those things and his daughter is supposed to bring all the medication to the hospital in  the afternoon.   PHYSICAL EXAMINATION: GENERAL: The patient is alert, oriented, cachectic and mild distress due to respiratory failure, but he is fully alert and oriented and cooperative.  HEAD AND NECK:  Atraumatic. Conjunctivae pink. Oral mucosa moist.  NECK: Supple. No JVD. Severe muscle wasting all over the body present.  LUNGS: Bilateral wheezing present. No crepitations. Equal air entry.  CARDIOVASCULAR: S1, S2 present,  regular, tachycardia. No murmur.  ABDOMEN: Soft, nontender. Bowel sounds present. No organomegaly.  SKIN: No rashes. Legs: No edema.  NEUROLOGICAL:  Power 5/5. Follows commands.   IMPORTANT LABORATORY RESULTS:  Glucose 109, BUN 23, creatinine 1.08, sodium 130, potassium 3.5, chloride 98, CO2 25. Troponin less than 0.02. WBC 8.2, hemoglobin 12.5, platelet count 229. X-ray of chest shows hyperinflation consistent with chronic obstructive pulmonary disease. New increased density in the right lower lobe hemithorax, posteriorly suspicious of atelectasis or early pneumonia. Small amount of pleural fluid might be present. EKG shows tachycardia.   ASSESSMENT AND PLAN: A 74 year old male with past medical history of chronic obstructive pulmonary disease and multiple vascular surgeries in the past. Current smoker. Came with chronic worsening of his respiratory status for the last 2 to 3 weeks and loss of weight, up to 20 pounds in the last two months.  1.  Chronic obstructive pulmonary disease exacerbation. We will give him IV steroid nebulizer treatment and oxygen supplementation. We will also do CT of the chest to rule out any malignancy due to history of weight loss and questionable atelectasis versus pneumonia in the right lower lobe on x-ray chest. He is a chronic smoker for 40 years.  2.  Possible pneumonia. As there is no increased white cell count or fever or sputum history, this might be atelectasis but due to his history of chronic obstructive pulmonary disease and chronic lung damage, we will give him antibiotics, and do the CT of the chest for further evaluation.  3.  Acute on chronic respiratory failure secondary to chronic obstructive pulmonary disease. We will give him oxygen to keep his oxygen saturation more than 88% and we will treat for chronic obstructive pulmonary disease.  4.  Weight loss. As mentioned above, possible malignancy cannot be ruled out due to chronic smoking history. We will do CT  of the chest  5.  History of vascular surgeries and carotid endarterectomy in the past. We will give aspirin for now and once his daughter brings all his home medications, we will need to reconsult properly.  6.  Urinary retention. He might need to stay off long acting anticholinergic medications for his chronic obstructive pulmonary disease at the time of discharge to prevent this side effect.  7.  Current smoker. Smoking cessation counseling done for five minutes and offered nicotine patch. The patient is already having from his home and he agrees to quit smoking this time    CODE STATUS is DNR. I confirmed with the patient these are his wishes and he said that his daughter is aware about that. The daughter is the healthcare power of attorney, but did not do any official paperwork so far. We will call [facility] for making this paperwork.   Total time spent on this admission: 50 minutes.   ____________________________ Hope PigeonVaibhavkumar G. Elisabeth PigeonVachhani, MD vgv:dp D: 09/14/2012 12:47:19 ET T: 09/14/2012 12:59:51 ET JOB#: [id number]  cc: Hope PigeonVaibhavkumar G. Elisabeth PigeonVachhani, MD, <Dictator> Altamese DillingVAIBHAVKUMAR Vetta Couzens MD ELECTRONICALLY SIGNED 09/14/2012 16:12

## 2014-06-16 NOTE — Discharge Summary (Signed)
PATIENT NAME:  Brian Sandoval, Brian Sandoval MR#:  [id number] DATE OF BIRTH:  1940-10-27  DATE OF ADMISSION:  09/14/2012 DATE OF DISCHARGE:  09/16/2012   PRIMARY CARE PHYSICIAN: Nonlocal.   DISCHARGE DIAGNOSES:  1.  Acute-on-chronic respiratory failure secondary to chronic obstructive pulmonary disease exacerbation  2.  Hypokalemia.  3.  Dehydration.  4.  Weight loss. 5.  Urinary retention.  6.  Anemia.  7.  Tobacco abuse.  CONDITION: Stable.   CODE STATUS: DNR.   HOME MEDICATIONS:  1.  Potassium chloride 20 mEq Sandoval.o. tablets 2 tablets b.i.d. 2.  Sodium bicarbonate 650 mg Sandoval.o. b.i.d.  3.  Symbicort 160 mcg/4.5 mcg inhalation 2 puffs b.i.d.  4.  Singulair 10 mg Sandoval.o. in the evening once a day. 5.  Sertraline 100 mg Sandoval.o. daily.  6.  Proventil HFA CFC-free 90 mcg inhalation aerosol 2 puffs four times a day.  7.  Prednisone 40 mg Sandoval.o. once a day for two days then taper.  8.  Aspirin 81 mg Sandoval.o. daily.  9.  Ambien 5 mg Sandoval.o. bedtime Sandoval.r.n.  10.  Spiriva 18 mcg inhalation capsule one capsule once a day.  11.  The patient needs home oxygen 2 liters by nasal cannula.   DIET: Regular diet.   ACTIVITY: As tolerated.   FOLLOWUP:  PCP within 1 to 2 weeks. The patient needs smoking cessation. Also, the patient needs to follow up with PCP for weight loss workup.   PRIMARY CARE PHYSICIAN: None local.   REASON FOR ADMISSION: Worsening shortness of breath.   HOSPITAL COURSE: The patient is a 74 year old male with a history of COPD on home oxygen and lost almost 20 pounds in the past two months, and he is getting progressively worse shortness of breath. The patient used to have oxygen continuously at night and in the daytime as needed, but for the past 2 to 3 weeks, the patient has worsening shortness of breath, has to use oxygen continuously 3 to 4 liters.   For detailed history and physical examination, please refer to admission note dictated by Dr. Elisabeth PigeonVachhani.   LABORATORY DATA: On admission date showed  glucose 109, BUN 23, creatinine 1.08, sodium 130, potassium 3.5. Troponin less than 0.02. WBC 8.2, hemoglobin 12.5. Chest x-ray showed hyper-inflammation consistent with COPD.  ASSESSMENT AND PLAN:  1.  Chronic obstructive pulmonary disease exacerbation and observation. The patient was treated with IV Solu-Medrol and oxygen supplement with nebulizer. The patient's shortness of breath and the cough has much improved, so Solu-Medrol was discontinued and patient was started with prednisone today. Initial chest x-ray showed questionable pneumonia, however, so the patient was treated with antibiotics, Zithromax, and Rocephin. However, repeat chest x-ray did not show any acute pneumonia.  2.  Acute-on-chronic respiratory failure, secondary to chronic obstructive pulmonary disease exacerbation. The patient's symptoms have much improved. We will continue home oxygen with a nebulizer.  4.  Weight loss. The patient may need further workup as outpatient.  5.  Tobacco abuse. The patient was counseled for smoking cessation and will continue nicotine patch.  6.  Hypokalemia. The patient's potassium was 2.9 and increased to 3.1 today, and he was treated with potassium supplement.  7.  Dehydration. The patient's BUN increased to 31 today. He is being treated with IV fluids today and encouraged to have more oral fluid intake.  8.  The patient is clinically stable and will be discharged to assisted living today.   I discussed the patient's discharge plan with the patient, case  Production designer, theatre/television/film, and nurse.   TIME SPENT: About 37 minutes.    ____________________________ Shaune Pollack, MD qc:np D: 09/16/2012 14:47:00 ET T: 09/16/2012 17:33:07 ET JOB#: [id number]  cc: Shaune Pollack, MD, <Dictator> Shaune Pollack MD ELECTRONICALLY SIGNED 09/17/2012 14:40
